# Patient Record
Sex: Female | Born: 1995 | Race: Black or African American | Hispanic: No | Marital: Single | Smoking: Current every day smoker
Health system: Southern US, Community
[De-identification: ages and names within clinical notes are randomized; demographics above are authoritative.]

## PROBLEM LIST (undated history)

## (undated) DIAGNOSIS — R569 Unspecified convulsions: Secondary | ICD-10-CM

---
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
## 2016-08-11 ENCOUNTER — Emergency Department (HOSPITAL_BASED_OUTPATIENT_CLINIC_OR_DEPARTMENT_OTHER)
Admission: EM | Admit: 2016-08-11 | Discharge: 2016-08-12 | Disposition: A | Discharge status: Home / Self Care | Payer: Self-pay | Attending: Emergency Medicine | Admitting: Emergency Medicine

## 2016-08-11 ENCOUNTER — Encounter (HOSPITAL_BASED_OUTPATIENT_CLINIC_OR_DEPARTMENT_OTHER): Payer: Self-pay | Admitting: Emergency Medicine

## 2016-08-11 DIAGNOSIS — S0990XA Unspecified injury of head, initial encounter: Secondary | ICD-10-CM

## 2016-08-11 DIAGNOSIS — Y9389 Activity, other specified: Secondary | ICD-10-CM | POA: Insufficient documentation

## 2016-08-11 DIAGNOSIS — Z23 Encounter for immunization: Secondary | ICD-10-CM | POA: Insufficient documentation

## 2016-08-11 DIAGNOSIS — Y929 Unspecified place or not applicable: Secondary | ICD-10-CM | POA: Insufficient documentation

## 2016-08-11 DIAGNOSIS — T07XXXA Unspecified multiple injuries, initial encounter: Secondary | ICD-10-CM

## 2016-08-11 DIAGNOSIS — S40212A Abrasion of left shoulder, initial encounter: Secondary | ICD-10-CM | POA: Insufficient documentation

## 2016-08-11 DIAGNOSIS — Y999 Unspecified external cause status: Secondary | ICD-10-CM | POA: Insufficient documentation

## 2016-08-11 DIAGNOSIS — S60812A Abrasion of left wrist, initial encounter: Secondary | ICD-10-CM | POA: Insufficient documentation
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
## 2016-08-12 ENCOUNTER — Emergency Department (HOSPITAL_BASED_OUTPATIENT_CLINIC_OR_DEPARTMENT_OTHER): Payer: Self-pay

## 2016-08-12 MED ORDER — HYDROCODONE-ACETAMINOPHEN 5-325 MG PO TABS: 1.0000 | ORAL_TABLET | Freq: Four times a day (QID) | ORAL | 0 refills | Status: AC | PRN

## 2016-08-12 MED ORDER — IBUPROFEN 800 MG PO TABS: 800.0000 mg | ORAL_TABLET | Freq: Three times a day (TID) | ORAL | 0 refills | Status: AC | PRN

## 2016-08-12 MED ORDER — TETANUS-DIPHTH-ACELL PERTUSSIS 5-2.5-18.5 LF-MCG/0.5 IM SUSP
0.5000 mL | Freq: Once | INTRAMUSCULAR | Status: AC
  Administered 2016-08-12: 0.5 mL via INTRAMUSCULAR
  Filled 2016-08-12: qty 0.5

## 2016-08-12 MED ORDER — HYDROCODONE-ACETAMINOPHEN 5-325 MG PO TABS
2.0000 | ORAL_TABLET | Freq: Once | ORAL | Status: AC
  Administered 2016-08-12: 2 via ORAL
  Filled 2016-08-12: qty 2

## 2016-08-12 MED ORDER — ONDANSETRON 4 MG PO TBDP
4.0000 mg | ORAL_TABLET | Freq: Once | ORAL | Status: AC
  Administered 2016-08-12: 4 mg via ORAL
  Filled 2016-08-12: qty 1

## 2016-08-12 NOTE — ED Notes (Signed)
Shoulder abrasion cleansed with wound cleanser and dressing applied. Pt updated.

## 2016-08-12 NOTE — ED Notes (Signed)
Pt given d/c instructions as per chart. Rx x 2 with instructions and precautions. Verbalizes understanding. No questions.

## 2016-08-12 NOTE — ED Notes (Signed)
ED Provider at bedside to go over results.

## 2017-06-14 ENCOUNTER — Other Ambulatory Visit: Payer: Self-pay

## 2017-06-14 ENCOUNTER — Encounter (HOSPITAL_BASED_OUTPATIENT_CLINIC_OR_DEPARTMENT_OTHER): Payer: Self-pay

## 2017-06-14 ENCOUNTER — Emergency Department (HOSPITAL_BASED_OUTPATIENT_CLINIC_OR_DEPARTMENT_OTHER): Payer: 59

## 2017-06-14 ENCOUNTER — Emergency Department (HOSPITAL_BASED_OUTPATIENT_CLINIC_OR_DEPARTMENT_OTHER)
Admission: EM | Admit: 2017-06-14 | Discharge: 2017-06-14 | Disposition: A | Discharge status: Home / Self Care | Payer: 59 | Attending: Physician Assistant | Admitting: Physician Assistant

## 2017-06-14 DIAGNOSIS — R1033 Periumbilical pain: Secondary | ICD-10-CM | POA: Diagnosis not present

## 2017-06-14 DIAGNOSIS — Z79899 Other long term (current) drug therapy: Secondary | ICD-10-CM | POA: Diagnosis not present

## 2017-06-14 DIAGNOSIS — F17210 Nicotine dependence, cigarettes, uncomplicated: Secondary | ICD-10-CM | POA: Diagnosis not present

## 2017-06-14 DIAGNOSIS — R112 Nausea with vomiting, unspecified: Secondary | ICD-10-CM | POA: Insufficient documentation

## 2017-06-14 LAB — URINALYSIS, ROUTINE W REFLEX MICROSCOPIC
Bilirubin Urine: NEGATIVE
Glucose, UA: NEGATIVE mg/dL
Ketones, ur: >80 mg/dL — AB
Leukocytes, UA: NEGATIVE
Nitrite: NEGATIVE
Protein, ur: 30 mg/dL — AB
Specific Gravity, Urine: 1.025 (ref 1.005–1.030)
pH: 7 (ref 5.0–8.0)

## 2017-06-14 LAB — COMPREHENSIVE METABOLIC PANEL
ALK PHOS: 74 U/L (ref 38–126)
ALT: 29 U/L (ref 14–54)
ANION GAP: 14 (ref 5–15)
AST: 45 U/L — ABNORMAL HIGH (ref 15–41)
Albumin: 4.7 g/dL (ref 3.5–5.0)
BILIRUBIN TOTAL: 0.5 mg/dL (ref 0.3–1.2)
BUN: 13 mg/dL (ref 6–20)
CO2: 19 mmol/L — ABNORMAL LOW (ref 22–32)
CREATININE: 1.05 mg/dL — AB (ref 0.44–1.00)
Calcium: 9.1 mg/dL (ref 8.9–10.3)
Chloride: 103 mmol/L (ref 101–111)
GFR calc non Af Amer: >60 mL/min (ref >60)
GLUCOSE: 111 mg/dL — AB (ref 65–99)
Potassium: 3.2 mmol/L — ABNORMAL LOW (ref 3.5–5.1)
Sodium: 136 mmol/L (ref 135–145)
TOTAL PROTEIN: 8.1 g/dL (ref 6.5–8.1)

## 2017-06-14 LAB — CBC WITH DIFFERENTIAL/PLATELET
Basophils Absolute: 0 10*3/uL (ref 0.0–0.1)
Basophils Relative: 0 %
Eosinophils Absolute: 0 10*3/uL (ref 0.0–0.7)
Eosinophils Relative: 0 %
HCT: 38.7 % (ref 36.0–46.0)
Hemoglobin: 13.8 g/dL (ref 12.0–15.0)
Lymphocytes Relative: 5 %
Lymphs Abs: 0.7 10*3/uL (ref 0.7–4.0)
MCH: 33.4 pg (ref 26.0–34.0)
MCHC: 35.7 g/dL (ref 30.0–36.0)
MCV: 93.7 fL (ref 78.0–100.0)
Monocytes Absolute: 0.6 10*3/uL (ref 0.1–1.0)
Monocytes Relative: 4 %
Neutro Abs: 14.2 10*3/uL — ABNORMAL HIGH (ref 1.7–7.7)
Neutrophils Relative %: 91 %
Platelets: 288 10*3/uL (ref 150–400)
RBC: 4.13 MIL/uL (ref 3.87–5.11)
RDW: 12.3 % (ref 11.5–15.5)
WBC: 15.5 10*3/uL — ABNORMAL HIGH (ref 4.0–10.5)

## 2017-06-14 LAB — PREGNANCY, URINE: Preg Test, Ur: NEGATIVE

## 2017-06-14 LAB — URINALYSIS, MICROSCOPIC (REFLEX)

## 2017-06-14 LAB — LIPASE, BLOOD: Lipase: 19 U/L (ref 11–51)

## 2017-06-14 MED ORDER — FAMOTIDINE IN NACL 20-0.9 MG/50ML-% IV SOLN
20.0000 mg | Freq: Once | INTRAVENOUS | Status: AC
  Administered 2017-06-14: 20 mg via INTRAVENOUS
  Filled 2017-06-14: qty 50

## 2017-06-14 MED ORDER — GI COCKTAIL ~~LOC~~
30.0000 mL | Freq: Once | ORAL | Status: AC
  Administered 2017-06-14: 30 mL via ORAL
  Filled 2017-06-14: qty 30

## 2017-06-14 MED ORDER — KETOROLAC TROMETHAMINE 30 MG/ML IJ SOLN
30.0000 mg | Freq: Once | INTRAMUSCULAR | Status: AC
  Administered 2017-06-14: 30 mg via INTRAVENOUS
  Filled 2017-06-14: qty 1

## 2017-06-14 MED ORDER — SODIUM CHLORIDE 0.9 % IV BOLUS
1000.0000 mL | Freq: Once | INTRAVENOUS | Status: AC
  Administered 2017-06-14: 1000 mL via INTRAVENOUS

## 2017-06-14 MED ORDER — POTASSIUM CHLORIDE CRYS ER 20 MEQ PO TBCR
40.0000 meq | EXTENDED_RELEASE_TABLET | Freq: Once | ORAL | Status: AC
  Administered 2017-06-14: 40 meq via ORAL
  Filled 2017-06-14: qty 2

## 2017-06-14 MED ORDER — ONDANSETRON 4 MG PO TBDP: ORAL_TABLET | ORAL | 0 refills | Status: AC

## 2017-06-14 MED ORDER — IOPAMIDOL (ISOVUE-300) INJECTION 61%
100.0000 mL | Freq: Once | INTRAVENOUS | Status: AC | PRN
  Administered 2017-06-14: 100 mL via INTRAVENOUS

## 2017-06-14 MED ORDER — ONDANSETRON 4 MG PO TBDP
ORAL_TABLET | ORAL | Status: AC
  Filled 2017-06-14: qty 1

## 2017-06-14 MED ORDER — PROMETHAZINE HCL 25 MG/ML IJ SOLN
25.0000 mg | Freq: Once | INTRAMUSCULAR | Status: AC
  Administered 2017-06-14: 25 mg via INTRAVENOUS
  Filled 2017-06-14: qty 1

## 2017-06-14 MED ORDER — FAMOTIDINE 20 MG PO TABS: 20.0000 mg | ORAL_TABLET | Freq: Two times a day (BID) | ORAL | 0 refills | Status: AC

## 2017-06-14 MED ORDER — ONDANSETRON 4 MG PO TBDP
4.0000 mg | ORAL_TABLET | Freq: Once | ORAL | Status: AC
Start: 2017-06-14 — End: 2017-06-14
  Administered 2017-06-14: 4 mg via ORAL
  Filled 2017-06-14: qty 1

## 2017-06-14 MED ORDER — ONDANSETRON HCL 4 MG/2ML IJ SOLN
INTRAMUSCULAR | Status: AC
  Administered 2017-06-14: 4 mg
  Filled 2017-06-14: qty 2

## 2017-06-14 NOTE — Discharge Instructions (Signed)
Evaluation today is overall reassuring, please use Zofran as needed for nausea, take Pepcid daily, you may use Tylenol for pain, please avoid ibuprofen as it can further irritate your stomach.    Your CT does not show any specific cause of your pain, it does show some nonspecific high density material in your stomach and small intestines, which may be something trying to be digested, it also shows a small amount of fluid around your gallbladder but this is thought to be because of the IV fluids he received here in your gallbladder does not appear inflamed.  These follow-up with your primary care doctor on Monday for recheck.  If you have any worsening or localizing of your abdominal pain, persistent vomiting and or unable to keep down fluids, fevers or chills or any other new or concerning symptoms do not hesitate to return to the emergency department.

## 2017-06-14 NOTE — ED Notes (Signed)
Requested urine specimen but pt sts she is not able to void right now. Is not willing to try at this time.

## 2017-06-14 NOTE — ED Provider Notes (Signed)
[facility] EMERGENCY DEPARTMENT Provider Note   CSN: [id number] Arrival date & time: 06/14/17  1346     History   Chief Complaint Chief Complaint  Patient presents with  . Emesis    HPI Beth Stephenson is a 22 y.o. female.  Beth Stephenson is a 22 y.o. Female with a history of seizures, presents to the ED for evaluation of abdominal pain and vomiting since this morning.  She reports symptoms started around 8 AM with epigastric and periumbilical abdominal pain and several episodes of emesis.  Patient denies any lower abdominal pain.  Patient denies any hematemesis.  She reports one episode of loose stool while here waiting in the emergency department, denies any melena or hematochezia.  She denies having symptoms like this previously.  She reports chills but no fevers.  No cough or URI symptoms no chest pain or shortness of breath.  Patient denies any urinary frequency, dysuria or hematuria no flank pain.  Patient denies any vaginal discharge or pelvic pain, she started her menstrual cycle today which is typical for her.  She reports he is sexually active only with women.  She does report she drank a large amount of alcohol last night which she does not typically do.  Patient also reports daily marijuana use and occasional cigarette use denies any other substances.     Past Medical History:  Diagnosis Date  . Seizure (HCC)     There are no active problems to display for this patient.   History reviewed. No pertinent surgical history.   OB History   None      Home Medications    Prior to Admission medications   Medication Sig Start Date End Date Taking? Authorizing Provider  famotidine (PEPCID) 20 MG tablet Take 1 tablet (20 mg total) by mouth 2 (two) times daily. 06/14/17   Dartha Lodge, PA-C  HYDROcodone-acetaminophen (NORCO/VICODIN) 5-325 MG tablet Take 1-2 tablets by mouth every 6 (six) hours as needed. 08/12/16   Ward, Layla Maw, DO  ibuprofen (ADVIL,MOTRIN) 800 MG  tablet Take 1 tablet (800 mg total) by mouth every 8 (eight) hours as needed for mild pain. 08/12/16   Ward, Layla Maw, DO  ondansetron (ZOFRAN ODT) 4 MG disintegrating tablet 4mg  ODT q4 hours prn nausea/vomit 06/14/17   Dartha Lodge, PA-C    Family History No family history on file.  Social History Social History   Tobacco Use  . Smoking status: Never Smoker  . Smokeless tobacco: Never Used  Substance Use Topics  . Alcohol use: No  . Drug use: No     Allergies   Patient has no known allergies.   Review of Systems Review of Systems  Constitutional: Positive for chills. Negative for fever.  HENT: Negative for congestion, rhinorrhea and sore throat.   Eyes: Negative for visual disturbance.  Respiratory: Negative for cough and shortness of breath.   Cardiovascular: Negative for chest pain.  Gastrointestinal: Positive for abdominal pain, diarrhea, nausea and vomiting. Negative for blood in stool.  Genitourinary: Negative for dysuria, flank pain, frequency, pelvic pain, vaginal bleeding and vaginal discharge.  Musculoskeletal: Negative for arthralgias and myalgias.  Skin: Negative for color change and rash.  Neurological: Negative for dizziness, syncope and light-headedness.     Physical Exam Updated Vital Signs BP 105/65 (BP Location: Right Arm)   Pulse 73   Temp 98.3 F (36.8 C) (Oral)   Resp 20   Ht 5\' 2"  (1.575 m)   Wt 59 kg (130  lb)   LMP 06/14/2017   SpO2 98%   BMI 23.78 kg/m   Physical Exam  Constitutional: She appears well-developed and well-nourished. No distress.  Vomiting during encounter, appears uncomfortable but in NAD  HENT:  Head: Normocephalic and atraumatic.  Mouth/Throat: Oropharynx is clear and moist.  Eyes: Right eye exhibits no discharge. Left eye exhibits no discharge.  Neck: Neck supple.  Cardiovascular: Normal rate, regular rhythm, normal heart sounds and intact distal pulses.  Pulmonary/Chest: Effort normal and breath sounds normal. No  stridor. No respiratory distress. She has no wheezes. She has no rales.  Respirations equal and unlabored, patient able to speak in full sentences, lungs clear to auscultation bilaterally  Abdominal: Soft. Bowel sounds are normal. She exhibits no distension and no mass. There is tenderness. There is guarding. There is no rebound.  Abdomen soft, non-distended, BS presents, focal tenderness in periumbilical and epigastric region with guarding, no RUQ tenderness, neg murphy's, no lower abdominal tenderness, no CVA tenderness  Neurological: She is alert. Coordination normal.  Skin: Skin is warm and dry. Capillary refill takes less than 2 seconds. She is not diaphoretic.  Psychiatric: She has a normal mood and affect. Her behavior is normal.  Nursing note and vitals reviewed.    ED Treatments / Results  Labs (all labs ordered are listed, but only abnormal results are displayed) Labs Reviewed  URINALYSIS, ROUTINE W REFLEX MICROSCOPIC - Abnormal; Notable for the following components:      Result Value   APPearance CLOUDY (*)    Hgb urine dipstick LARGE (*)    Ketones, ur >80 (*)    Protein, ur 30 (*)    All other components within normal limits  CBC WITH DIFFERENTIAL/PLATELET - Abnormal; Notable for the following components:   WBC 15.5 (*)    Neutro Abs 14.2 (*)    All other components within normal limits  COMPREHENSIVE METABOLIC PANEL - Abnormal; Notable for the following components:   Potassium 3.2 (*)    CO2 19 (*)    Glucose, Bld 111 (*)    Creatinine, Ser 1.05 (*)    AST 45 (*)    All other components within normal limits  URINALYSIS, MICROSCOPIC (REFLEX) - Abnormal; Notable for the following components:   Bacteria, UA RARE (*)    Squamous Epithelial / LPF 0-5 (*)    All other components within normal limits  PREGNANCY, URINE  LIPASE, BLOOD    EKG None  Radiology Ct Abdomen Pelvis W Contrast  Result Date: 06/14/2017 CLINICAL DATA:  Nausea and vomiting. Periumbilical  pain. No fever. Elevated white blood cell count EXAM: CT ABDOMEN AND PELVIS WITH CONTRAST TECHNIQUE: Multidetector CT imaging of the abdomen and pelvis was performed using the standard protocol following bolus administration of intravenous contrast. CONTRAST:  100mL ISOVUE-300 IOPAMIDOL (ISOVUE-300) INJECTION 61% COMPARISON:  None. FINDINGS: Lower chest: Lung bases are clear. Hepatobiliary: No focal hepatic lesion. Mild periportal edema. There is small amount pericholecystic fluid. No gallbladder distension. No radiodense gallstones. Common bile duct normal caliber. Pancreas: Pancreas is normal. No ductal dilatation. No pancreatic inflammation. Spleen: Normal spleen Adrenals/urinary tract: Adrenal glands and kidneys are normal. The ureters and bladder normal. Stomach/Bowel: Stomach, duodenum small bowel are normal. Scattered high-density material in the lower abdomen which is favored within the small bowel. Same density of high-density material within the stomach. (Image 50/2 and 45/2 for example). Favor ingested material The appendix is partially imaged appears retrocecal without inflammation. The ascending transverse colon normal. Descending colon rectosigmoid colon  normal. Vascular/Lymphatic: Abdominal aorta is normal caliber. No periportal or retroperitoneal adenopathy. No pelvic adenopathy. Reproductive: Uterus and ovaries normal.  Smaller free fluid pelv Other: This no ventral hernia Musculoskeletal: No aggressive osseous lesion. IMPRESSION: 1. Mild pericholecystic fluid likely represents likely related to the periportal edema. Query fluid resuscitation 2. Appendix is partially imaged and appears normal. 3. Linear high density material within the stomach and small bowel is likely ingested material. 4. Small amount free fluid the pelvis Electronically Signed   By: Genevive Bi M.D.   On: 06/14/2017 20:03    Procedures Procedures (including critical care time)  Medications Ordered in ED Medications    ondansetron (ZOFRAN-ODT) disintegrating tablet 4 mg (4 mg Oral Given 06/14/17 1615)  ondansetron (ZOFRAN) 4 MG/2ML injection (4 mg  Given 06/14/17 1558)  sodium chloride 0.9 % bolus 1,000 mL (0 mLs Intravenous Stopped 06/14/17 1654)  famotidine (PEPCID) IVPB 20 mg premix (0 mg Intravenous Stopped 06/14/17 1652)  potassium chloride SA (K-DUR,KLOR-CON) CR tablet 40 mEq (40 mEq Oral Given 06/14/17 1658)  sodium chloride 0.9 % bolus 1,000 mL (0 mLs Intravenous Stopped 06/14/17 1841)  ketorolac (TORADOL) 30 MG/ML injection 30 mg (30 mg Intravenous Given 06/14/17 1656)  gi cocktail (Maalox,Lidocaine,Donnatal) (30 mLs Oral Given 06/14/17 1659)  promethazine (PHENERGAN) injection 25 mg (25 mg Intravenous Given 06/14/17 1718)  iopamidol (ISOVUE-300) 61 % injection 100 mL (100 mLs Intravenous Contrast Given 06/14/17 1921)     Initial Impression / Assessment and Plan / ED Course  I have reviewed the triage vital signs and the nursing notes.  Pertinent labs & imaging results that were available during my care of the patient were reviewed by me and considered in my medical decision making (see chart for details).   Patient presents to the ED for evaluation of acute onset of epigastric and periumbilical abdominal pain, nausea and vomiting.  No associated fevers, no lower abdominal pain, no urinary or vaginal symptoms.  Patient does report drinking a large amount of alcohol last night.  Patient is actively vomiting on exam.  Vitals normal, focal tenderness in the epigastric and periumbilical regions, no right upper quadrant or lower abdominal tenderness.  Given location of tenderness, acute gastritis more likely, given large amount of alcohol during patient could also have alcoholic pancreatitis, or early appendicitis given periumbilical tenderness, will get labs and begin symptomatic treatment with IV fluids, Zofran, will start with Toradol for pain.  Patient with leukocytosis of 15.5 with left shift, normal  hemoglobin, potassium is 3.2 will replace orally, no other acute electrolyte derangements requiring intervention, creatinine is slightly increased at 1.05, likely in the setting of dehydration, 2 L fluid bolus given. LFTs are unremarkable, lipase is normal, urinalysis without evidence of infection and negative urine pregnancy.  On reevaluation patient is continuing to vomit despite Zofran continuing to complain of pain attempted GI cocktail but patient vomited this up, will get Phenergan given persistent tenderness on abdominal exam, and continued vomiting will get CT scan to assess for any acute intraabdominal pathology causing symptoms.  CT shows mild pericholecystic fluid without any evidence of gallbladder inflammation this is thought to likely be in the setting of fluid resuscitation, the appendix is partially imaged and appears normal, there is some linear high density material within the stomach and small bowel but is likely ingested material, this could potentially be the Maalox from the GI cocktail that the patient received she also reports taking Pepto-Bismol has not ingested any other concerning substances, no other acute findings  on CT scan.  Discussed these results with the patient she is feeling much better symptomatically with no additional episodes of vomiting and vast improvement in her pain abdominal exam is now benign do not feel any additional imaging or evaluations are necessary at this time will have patient take Zofran as needed for nausea and Pepcid encouraged her to advance diet slowly, encourage her to avoid alcohol and marijuana while symptoms are resolving.  Strict return precautions discussed with patient has any worsening or localizing abdominal pain she is to return to the emergency department.  She is to follow-up with your primary care doctor.  She expresses understanding and is in agreement with plan.    Final Clinical Impressions(s) / ED Diagnoses   Final diagnoses:    Non-intractable vomiting with nausea, unspecified vomiting type  Periumbilical abdominal pain    ED Discharge Orders        Ordered    ondansetron (ZOFRAN ODT) 4 MG disintegrating tablet     06/14/17 2018    famotidine (PEPCID) 20 MG tablet  2 times daily     06/14/17 2018       Dartha Lodge, PA-C 06/15/17 1158    Mackuen, Cindee Salt, MD 06/15/17 [phone]

## 2017-06-14 NOTE — ED Notes (Signed)
CT must wait for urine pregnancy test to result prior to imaging [facility]

## 2017-06-14 NOTE — ED Notes (Signed)
ED Provider at bedside. 

## 2017-06-14 NOTE — ED Triage Notes (Signed)
Pt c/o vomiting with abdominal pain since this morning

## 2018-01-11 ENCOUNTER — Emergency Department (HOSPITAL_BASED_OUTPATIENT_CLINIC_OR_DEPARTMENT_OTHER)
Admission: EM | Admit: 2018-01-11 | Discharge: 2018-01-11 | Disposition: A | Discharge status: Home / Self Care | Payer: 59 | Attending: Emergency Medicine | Admitting: Emergency Medicine

## 2018-01-11 ENCOUNTER — Encounter (HOSPITAL_BASED_OUTPATIENT_CLINIC_OR_DEPARTMENT_OTHER): Payer: Self-pay | Admitting: Emergency Medicine

## 2018-01-11 ENCOUNTER — Other Ambulatory Visit: Payer: Self-pay

## 2018-01-11 DIAGNOSIS — F17210 Nicotine dependence, cigarettes, uncomplicated: Secondary | ICD-10-CM | POA: Diagnosis not present

## 2018-01-11 DIAGNOSIS — R1030 Lower abdominal pain, unspecified: Secondary | ICD-10-CM | POA: Diagnosis present

## 2018-01-11 LAB — CBC WITH DIFFERENTIAL/PLATELET
Abs Immature Granulocytes: 0.04 10*3/uL (ref 0.00–0.07)
Basophils Absolute: 0 10*3/uL (ref 0.0–0.1)
Basophils Relative: 0 %
EOS ABS: 0.1 10*3/uL (ref 0.0–0.5)
EOS PCT: 1 %
HCT: 38.2 % (ref 36.0–46.0)
Hemoglobin: 12.3 g/dL (ref 12.0–15.0)
IMMATURE GRANULOCYTES: 0 %
LYMPHS ABS: 1.5 10*3/uL (ref 0.7–4.0)
Lymphocytes Relative: 14 %
MCH: 31.3 pg (ref 26.0–34.0)
MCHC: 32.2 g/dL (ref 30.0–36.0)
MCV: 97.2 fL (ref 80.0–100.0)
Monocytes Absolute: 0.9 10*3/uL (ref 0.1–1.0)
Monocytes Relative: 8 %
Neutro Abs: 8.2 10*3/uL — ABNORMAL HIGH (ref 1.7–7.7)
Neutrophils Relative %: 77 %
PLATELETS: 256 10*3/uL (ref 150–400)
RBC: 3.93 MIL/uL (ref 3.87–5.11)
RDW: 12.2 % (ref 11.5–15.5)
WBC: 10.8 10*3/uL — ABNORMAL HIGH (ref 4.0–10.5)
nRBC: 0 % (ref 0.0–0.2)

## 2018-01-11 LAB — URINALYSIS, MICROSCOPIC (REFLEX)

## 2018-01-11 LAB — COMPREHENSIVE METABOLIC PANEL
ALBUMIN: 3.9 g/dL (ref 3.5–5.0)
ALT: 14 U/L (ref 0–44)
ANION GAP: 9 (ref 5–15)
AST: 20 U/L (ref 15–41)
Alkaline Phosphatase: 50 U/L (ref 38–126)
BUN: 14 mg/dL (ref 6–20)
CHLORIDE: 108 mmol/L (ref 98–111)
CO2: 23 mmol/L (ref 22–32)
CREATININE: 0.92 mg/dL (ref 0.44–1.00)
Calcium: 9.2 mg/dL (ref 8.9–10.3)
GFR calc Af Amer: >60 mL/min (ref >60)
GFR calc non Af Amer: >60 mL/min (ref >60)
GLUCOSE: 89 mg/dL (ref 70–99)
Potassium: 4 mmol/L (ref 3.5–5.1)
SODIUM: 140 mmol/L (ref 135–145)
Total Bilirubin: 0.3 mg/dL (ref 0.3–1.2)
Total Protein: 6.4 g/dL — ABNORMAL LOW (ref 6.5–8.1)

## 2018-01-11 LAB — URINALYSIS, ROUTINE W REFLEX MICROSCOPIC
Bilirubin Urine: NEGATIVE
Glucose, UA: NEGATIVE mg/dL
Ketones, ur: 15 mg/dL — AB
Leukocytes, UA: NEGATIVE
NITRITE: NEGATIVE
Protein, ur: NEGATIVE mg/dL
pH: 6 (ref 5.0–8.0)

## 2018-01-11 LAB — PREGNANCY, URINE: PREG TEST UR: NEGATIVE

## 2018-01-11 LAB — LIPASE, BLOOD: Lipase: 29 U/L (ref 11–51)

## 2018-01-11 MED ORDER — SODIUM CHLORIDE 0.9 % IV BOLUS
1000.0000 mL | Freq: Once | INTRAVENOUS | Status: AC
  Administered 2018-01-11: 1000 mL via INTRAVENOUS

## 2018-01-11 MED ORDER — NAPROXEN 500 MG PO TABS: 500.0000 mg | ORAL_TABLET | Freq: Two times a day (BID) | ORAL | 0 refills | Status: AC

## 2018-01-11 MED ORDER — KETOROLAC TROMETHAMINE 15 MG/ML IJ SOLN
15.0000 mg | Freq: Once | INTRAMUSCULAR | Status: AC
  Administered 2018-01-11: 15 mg via INTRAVENOUS
  Filled 2018-01-11: qty 1

## 2018-01-11 NOTE — ED Triage Notes (Signed)
Patient reports lower abdominal pain that began today.  States that she has history of ovarian cysts and this feels similar to that.  Reports 1 episode of vomiting.  States menstrual cycle started yesterday.

## 2018-01-11 NOTE — Discharge Instructions (Signed)
Take naproxen 2 times a day with meals.  Do not take other anti-inflammatories at the same time (Advil, Motrin, ibuprofen, Aleve). You may supplement with Tylenol if you need further pain control. Use heating pads to help control your pain. It is important that you follow-up with OB/GYN.  There is information for 2 practices listed below. Return to the emergency room if you develop high fevers, persistent vomiting, severe worsening abdominal pain, or any new, concerning, or worsening symptoms.

## 2018-01-11 NOTE — ED Provider Notes (Signed)
[facility] EMERGENCY DEPARTMENT Provider Note   CSN: [id number] Arrival date & time: 01/11/18  4098     History   Chief Complaint Chief Complaint  Patient presents with  . Abdominal Pain    HPI Beth Stephenson is a 22 y.o. female senting for evaluation of lower abdominal pain and one episode of vomiting.  Patient states that when she woke up this morning, she had lower abdominal pain.  Not wake her up from sleep, but she noticed it when she was awake.  It is suprapubic.  Described as sharp and constant.  Nothing makes it better or worse.  She had one episode of vomiting, but reports no nausea.  She states this feels similar to last month when she started her period and was diagnosed with an ovarian cyst.  She states that she does have period cramps, but they are normally mild.  For the past 3 days, she has been having loose stools, they are nonbloody.  She has not taken anything for pain including Tylenol or ibuprofen.  Patient states she had 5-6 shots last night, denies other alcohol use.  She denies tobacco or drug use.  She denies fevers, chills, chest pain, shortness of breath, upper abdominal pain, urinary symptoms. She denies vaginal discharge. She started her period yesterday. She is sexually active with one female partner. She does not use contraception. She denies a history of abdominal problems or previous abdominal surgeries.  History obtained from chart review.  Patient seen in the ER in February in which she had a normal CT scan.  She was seen at [facility] ER last month for similar suprapubic abdominal pain, during which an ultrasound showed cysts and fluid consistent with recently burst ovarian cyst.  She was supposed to follow-up with OB/GYN, but never did.  HPI  Past Medical History:  Diagnosis Date  . Seizure (HCC)     There are no active problems to display for this patient.   History reviewed. No pertinent surgical history.   OB History   None       Home Medications    Prior to Admission medications   Medication Sig Start Date End Date Taking? Authorizing Provider  famotidine (PEPCID) 20 MG tablet Take 1 tablet (20 mg total) by mouth 2 (two) times daily. 06/14/17   Dartha Lodge, PA-C  HYDROcodone-acetaminophen (NORCO/VICODIN) 5-325 MG tablet Take 1-2 tablets by mouth every 6 (six) hours as needed. 08/12/16   Ward, Layla Maw, DO  ibuprofen (ADVIL,MOTRIN) 800 MG tablet Take 1 tablet (800 mg total) by mouth every 8 (eight) hours as needed for mild pain. 08/12/16   Ward, Layla Maw, DO  naproxen (NAPROSYN) 500 MG tablet Take 1 tablet (500 mg total) by mouth 2 (two) times daily with a meal. 01/11/18   Riely Oetken, PA-C  ondansetron (ZOFRAN ODT) 4 MG disintegrating tablet 4mg  ODT q4 hours prn nausea/vomit 06/14/17   Dartha Lodge, PA-C    Family History History reviewed. No pertinent family history.  Social History Social History   Tobacco Use  . Smoking status: Current Every Day Smoker    Packs/day: 0.50    Types: Cigarettes  . Smokeless tobacco: Never Used  Substance Use Topics  . Alcohol use: No  . Drug use: No     Allergies   Patient has no known allergies.   Review of Systems Review of Systems  Gastrointestinal: Positive for vomiting (one episode).  Genitourinary: Positive for pelvic pain.  All other systems  reviewed and are negative.    Physical Exam Updated Vital Signs BP 110/65 (BP Location: Left Arm)   Pulse 65   Temp 98.2 F (36.8 C) (Oral)   Resp 18   Ht 5\' 2"  (1.575 m)   Wt 57.2 kg   LMP 01/11/2018   SpO2 100%   BMI 23.05 kg/m   Physical Exam  Constitutional: She is oriented to person, place, and time. She appears well-developed and well-nourished. No distress.  Laying comfortably in the bed in no acute distress  HENT:  Head: Normocephalic and atraumatic.  Eyes: Pupils are equal, round, and reactive to light. Conjunctivae and EOM are normal.  Neck: Normal range of motion. Neck supple.   Cardiovascular: Normal rate, regular rhythm and intact distal pulses.  Pulmonary/Chest: Effort normal and breath sounds normal. No respiratory distress. She has no wheezes.  Abdominal: Soft. She exhibits no distension and no mass. There is tenderness. There is no rebound and no guarding.  Mild suprapubic tenderness without rigidity, guarding, distention.  No tenderness at McBurney's point.  Negative Murphy sign.  Negative rebound.  Musculoskeletal: Normal range of motion.  Neurological: She is alert and oriented to person, place, and time.  Skin: Skin is warm and dry. Capillary refill takes less than 2 seconds.  Psychiatric: She has a normal mood and affect.  Nursing note and vitals reviewed.    ED Treatments / Results  Labs (all labs ordered are listed, but only abnormal results are displayed) Labs Reviewed  URINALYSIS, ROUTINE W REFLEX MICROSCOPIC - Abnormal; Notable for the following components:      Result Value   Color, Urine AMBER (*)    APPearance CLOUDY (*)    Specific Gravity, Urine >1.030 (*)    Hgb urine dipstick LARGE (*)    Ketones, ur 15 (*)    All other components within normal limits  URINALYSIS, MICROSCOPIC (REFLEX) - Abnormal; Notable for the following components:   Bacteria, UA MANY (*)    All other components within normal limits  CBC WITH DIFFERENTIAL/PLATELET - Abnormal; Notable for the following components:   WBC 10.8 (*)    Neutro Abs 8.2 (*)    All other components within normal limits  COMPREHENSIVE METABOLIC PANEL - Abnormal; Notable for the following components:   Total Protein 6.4 (*)    All other components within normal limits  PREGNANCY, URINE  LIPASE, BLOOD    EKG None  Radiology No results found.  Procedures Procedures (including critical care time)  Medications Ordered in ED Medications  sodium chloride 0.9 % bolus 1,000 mL (0 mLs Intravenous Stopped 01/11/18 1241)  ketorolac (TORADOL) 15 MG/ML injection 15 mg (15 mg Intravenous  Given 01/11/18 1128)     Initial Impression / Assessment and Plan / ED Course  I have reviewed the triage vital signs and the nursing notes.  Pertinent labs & imaging results that were available during my care of the patient were reviewed by me and considered in my medical decision making (see chart for details).     Pt presenting for evaluation of suprapubic abdominal pain.  Physical exam reassuring, she is afebrile not tachycardic.  Appears nontoxic.  Mild tenderness palpation of suprapubic abdomen.  Has had similar symptoms, with previous CT and ultrasound which showed ovarian cyst but otherwise reassuring.  Patient started her period yesterday.  Additionally, she had 5-6 shots of alcohol last night, and has been having several days of diarrhea.  Consider uterine cramping versus gastritis versus cramping due to diarrhea.  Will obtain labs for further evaluation, and tx with fluids and toradol and reassess.  Labs reassuring, leukocytosis improved from previous, mildly elevated at 10.7.  Otherwise, kidney, liver, pancreatic function reassuring.  Urine without obvious infection.  On reassessment, patient reports pain is improved.  Repeat abdominal exam reassuring, abdomen is soft and nontender.  Discussed follow-up with OB/GYN for further evaluation.  At this time, doubt intra-abdominal infection, perforation, obstruction, or surgical abdomen.  I do not believe CT scan or repeat ultrasound is necessary at this time.  At this time, patient appears safe for discharge.  Return precautions given.  Patient states she understands and agrees to plan.  Final Clinical Impressions(s) / ED Diagnoses   Final diagnoses:  Lower abdominal pain    ED Discharge Orders         Ordered    naproxen (NAPROSYN) 500 MG tablet  2 times daily with meals     01/11/18 1235           Sydell Prowell, PA-C 01/11/18 1301    Arby BarrettePfeiffer, Marcy, MD 01/12/18 [phone]

## 2019-05-02 IMAGING — CT CT ABD-PELV W/ CM
2 of 4 series · 16 of 46 positions shown, 18 images · IV contrast (APPLIED)
Comparison: None.

CLINICAL DATA: Nausea and vomiting. Periumbilical pain. No fever.
Elevated white blood cell count

EXAM:
CT ABDOMEN AND PELVIS WITH CONTRAST
TECHNIQUE: Multidetector CT imaging of the abdomen and pelvis was performed
using the standard protocol following bolus administration of
intravenous contrast.
CONTRAST:  100mL [id number] IOPAMIDOL ([id number]) INJECTION 61%

[Series 2: axial st · axial · 0.65mm/px · z∈[-481,-106]mm · 13 of 83 slices shown, 15 images]
[im 4/83  soft-tissue]
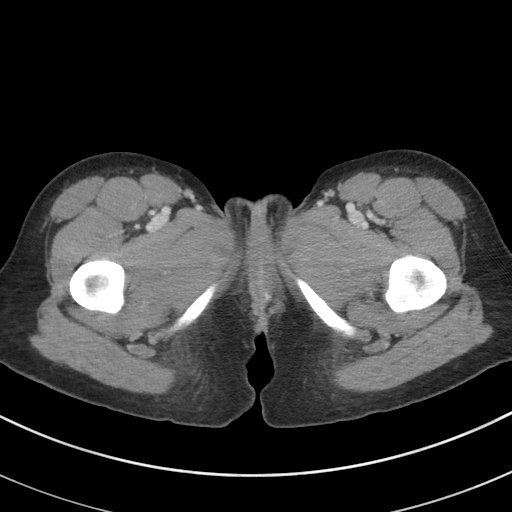
[im 4/83  bone]
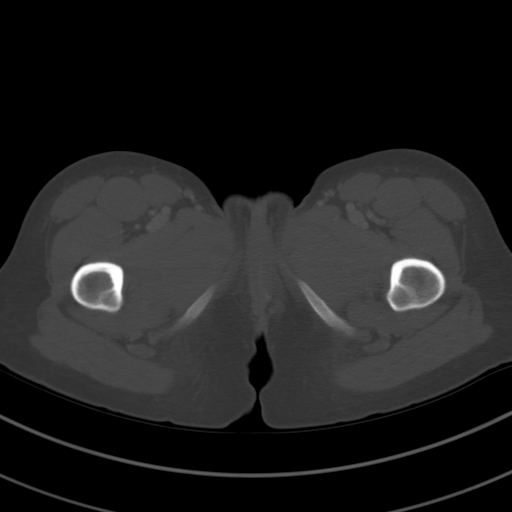
[im 11/83  soft-tissue]
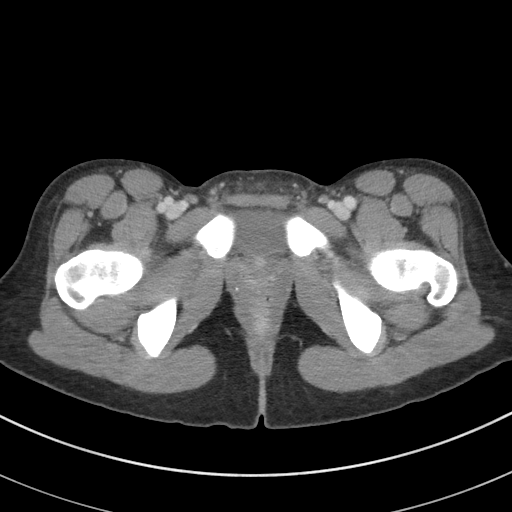
[im 18/83  soft-tissue]
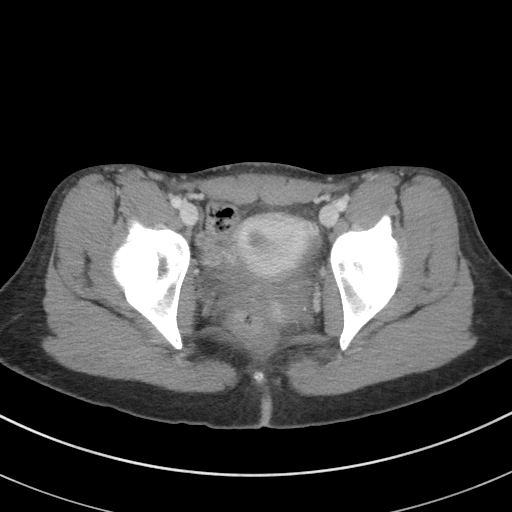
[im 24/83  soft-tissue]
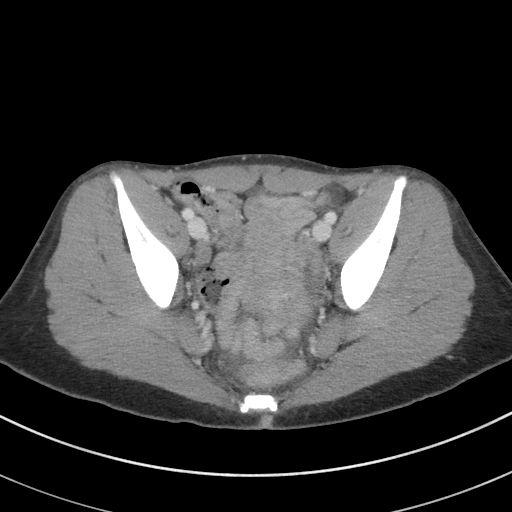
[im 28/83  soft-tissue]
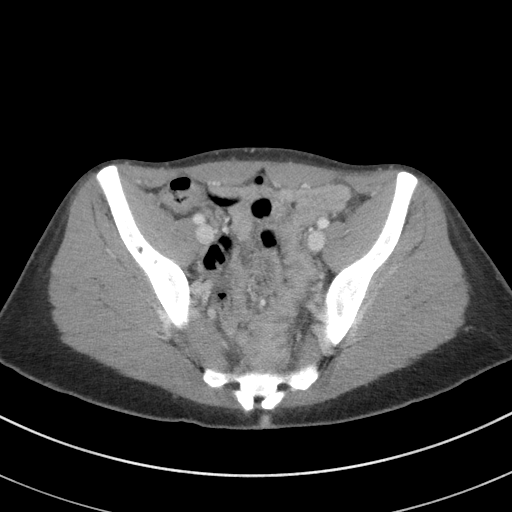
[im 35/83  soft-tissue]
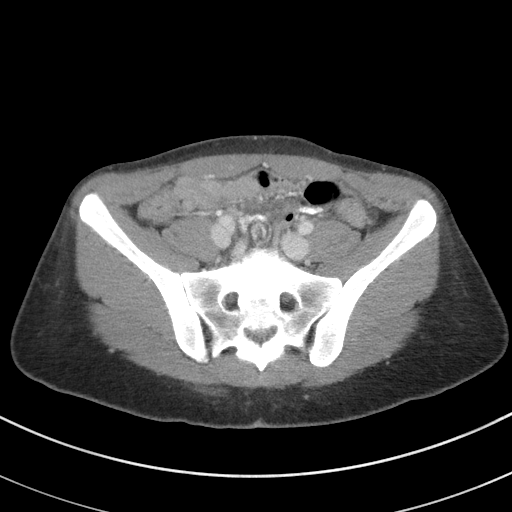
[im 42/83  soft-tissue]
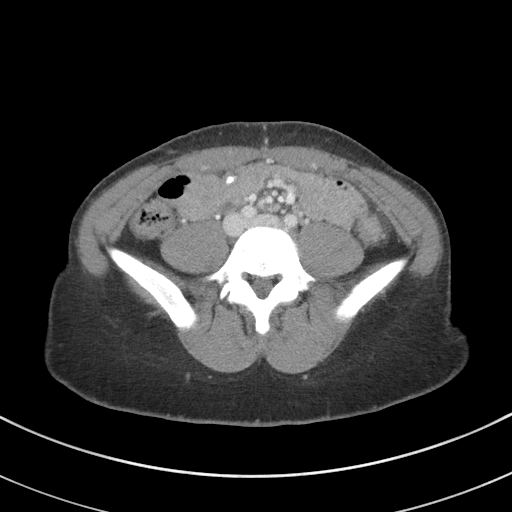
[im 48/83  soft-tissue]
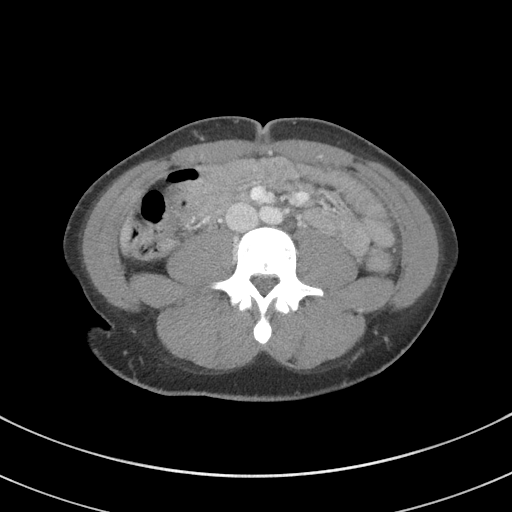
[im 55/83  soft-tissue]
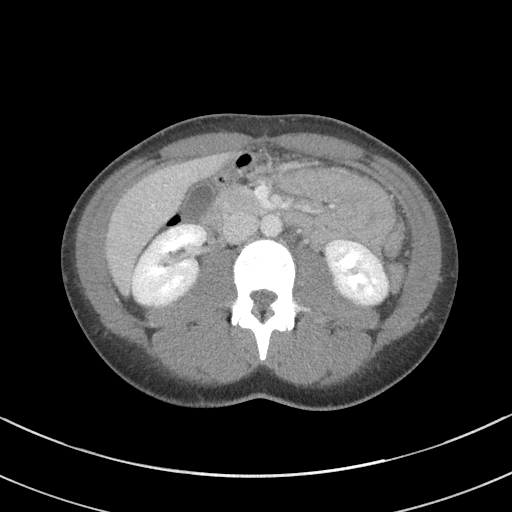
[im 55/83  bone]
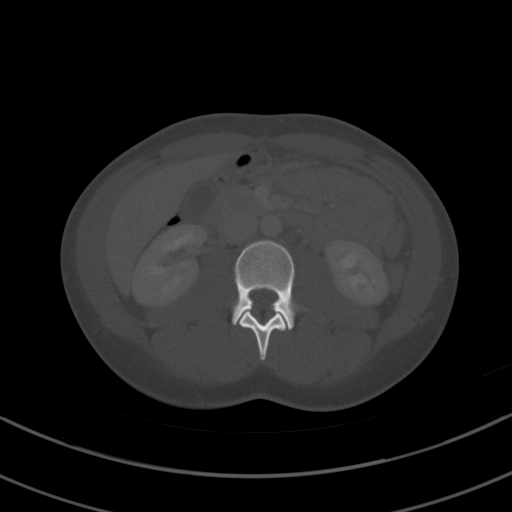
[im 59/83  soft-tissue]
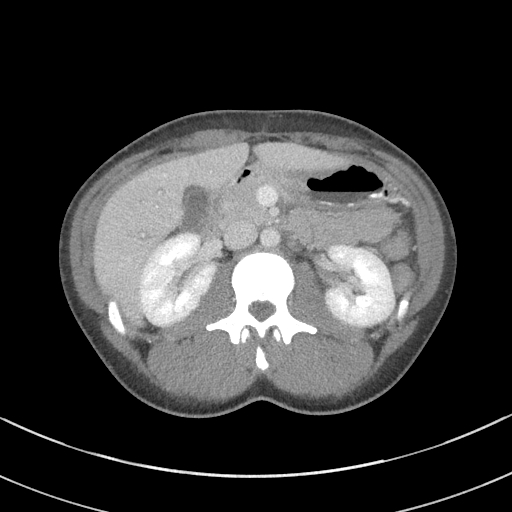
[im 65/83  soft-tissue]
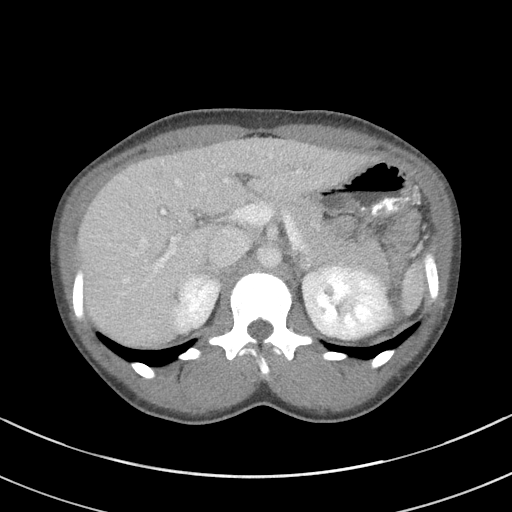
[im 72/83  soft-tissue]
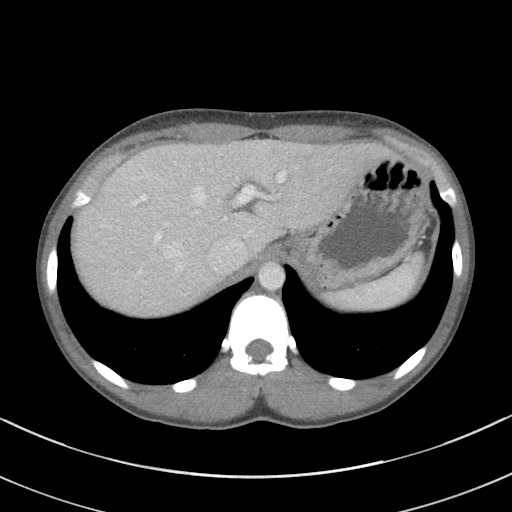
[im 79/83  soft-tissue]
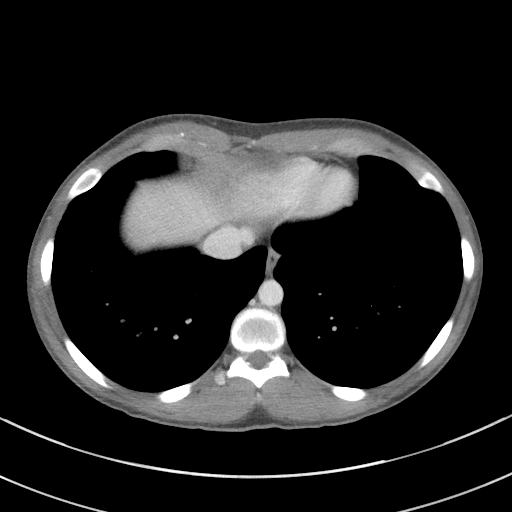

[Series 5: coronal st · coronal · 0.67mm/px · 3 of 67 slices shown]
[im 23/67  soft-tissue]
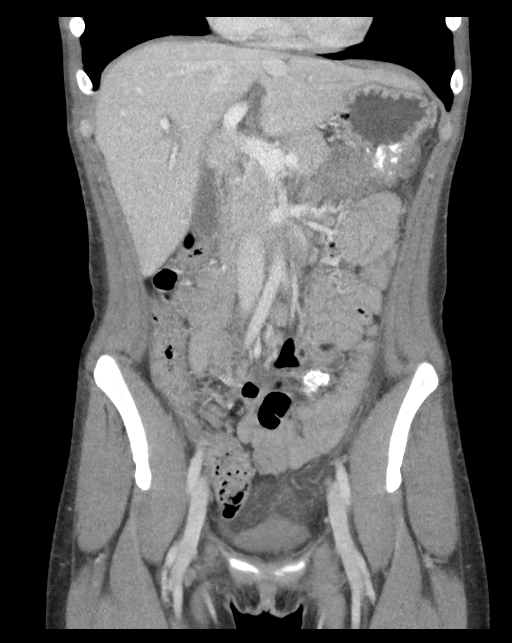
[im 30/67  soft-tissue]
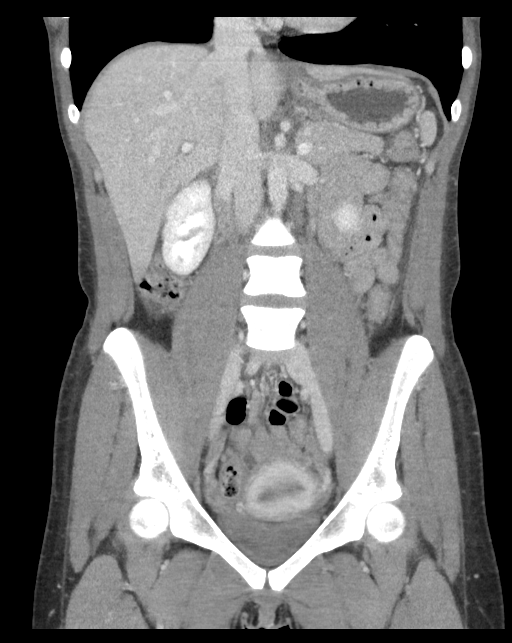
[im 37/67  soft-tissue]
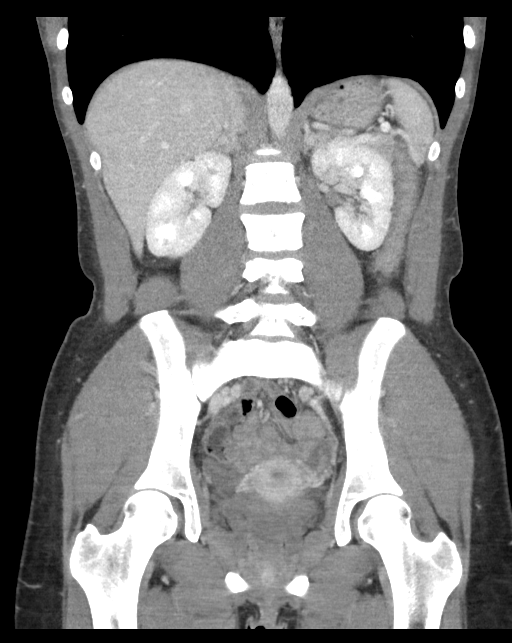

[16 of 46 positions shown; findings below may reference images not displayed]

FINDINGS: Lower chest: Lung bases are clear.

Hepatobiliary: No focal hepatic lesion. Mild periportal edema. There
is small amount pericholecystic fluid. No gallbladder distension. No
radiodense gallstones. Common bile duct normal caliber.

Pancreas: Pancreas is normal. No ductal dilatation. No pancreatic
inflammation.

Spleen: Normal spleen

Adrenals/urinary tract: Adrenal glands and kidneys are normal. The
ureters and bladder normal.

Stomach/Bowel: Stomach, duodenum small bowel are normal. Scattered
high-density material in the lower abdomen which is favored within
the small bowel. Same density of high-density material within the
stomach. (Image 50/2 and 45/2 for example). Favor ingested material

The appendix is partially imaged appears retrocecal without
inflammation. The ascending transverse colon normal. Descending
colon rectosigmoid colon normal.

Vascular/Lymphatic: Abdominal aorta is normal caliber. No periportal
or retroperitoneal adenopathy. No pelvic adenopathy.

Reproductive: Uterus and ovaries normal.  Smaller free fluid pelv

Other: This no ventral hernia

Musculoskeletal: No aggressive osseous lesion.
IMPRESSION: 1. Mild pericholecystic fluid likely represents likely related to
the periportal edema. Query fluid resuscitation
2. Appendix is partially imaged and appears normal.
3. Linear high density material within the stomach and small bowel
is likely ingested material.
4. Small amount free fluid the pelvis

## 2019-06-25 ENCOUNTER — Encounter (HOSPITAL_BASED_OUTPATIENT_CLINIC_OR_DEPARTMENT_OTHER): Payer: Self-pay | Admitting: *Deleted

## 2019-06-25 ENCOUNTER — Other Ambulatory Visit: Payer: Self-pay

## 2019-06-25 DIAGNOSIS — M549 Dorsalgia, unspecified: Secondary | ICD-10-CM | POA: Insufficient documentation

## 2019-06-25 DIAGNOSIS — Z5321 Procedure and treatment not carried out due to patient leaving prior to being seen by health care provider: Secondary | ICD-10-CM | POA: Insufficient documentation

## 2019-06-25 LAB — URINALYSIS, ROUTINE W REFLEX MICROSCOPIC
Bilirubin Urine: NEGATIVE
Glucose, UA: NEGATIVE mg/dL
Ketones, ur: 15 mg/dL — AB
Leukocytes,Ua: NEGATIVE
Nitrite: NEGATIVE
Protein, ur: NEGATIVE mg/dL
Specific Gravity, Urine: 1.015 (ref 1.005–1.030)
pH: 8 (ref 5.0–8.0)

## 2019-06-25 LAB — URINALYSIS, MICROSCOPIC (REFLEX)
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
## 2019-06-26 ENCOUNTER — Emergency Department (HOSPITAL_BASED_OUTPATIENT_CLINIC_OR_DEPARTMENT_OTHER)
Admission: EM | Admit: 2019-06-26 | Discharge: 2019-06-26 | Disposition: A | Discharge status: Home / Self Care | Payer: Self-pay | Attending: Emergency Medicine | Admitting: Emergency Medicine

## 2019-06-26 NOTE — ED Notes (Signed)
This RN checked on patient in room, offered comfort measures. Pt stating she wants pain medication. Informed her she would have to wait on MD for evaluation. Pt decided to leave due to wait.

## 2022-12-02 ENCOUNTER — Emergency Department (HOSPITAL_BASED_OUTPATIENT_CLINIC_OR_DEPARTMENT_OTHER): Payer: Self-pay

## 2022-12-02 ENCOUNTER — Emergency Department (HOSPITAL_BASED_OUTPATIENT_CLINIC_OR_DEPARTMENT_OTHER)
Admission: EM | Admit: 2022-12-02 | Discharge: 2022-12-02 | Disposition: A | Discharge status: Home / Self Care | Payer: Self-pay | Attending: Emergency Medicine | Admitting: Emergency Medicine

## 2022-12-02 ENCOUNTER — Encounter (HOSPITAL_BASED_OUTPATIENT_CLINIC_OR_DEPARTMENT_OTHER): Payer: Self-pay

## 2022-12-02 ENCOUNTER — Other Ambulatory Visit: Payer: Self-pay

## 2022-12-02 DIAGNOSIS — R079 Chest pain, unspecified: Secondary | ICD-10-CM | POA: Insufficient documentation

## 2022-12-02 LAB — COMPREHENSIVE METABOLIC PANEL
ALT: 26 U/L (ref 0–44)
AST: 32 U/L (ref 15–41)
Albumin: 3.5 g/dL (ref 3.5–5.0)
Alkaline Phosphatase: 66 U/L (ref 38–126)
Anion gap: 14 (ref 5–15)
BUN: 15 mg/dL (ref 6–20)
CO2: 22 mmol/L (ref 22–32)
Calcium: 8.1 mg/dL — ABNORMAL LOW (ref 8.9–10.3)
Chloride: 102 mmol/L (ref 98–111)
Creatinine, Ser: 1.15 mg/dL — ABNORMAL HIGH (ref 0.44–1.00)
GFR, Estimated: >60 mL/min (ref >60)
Glucose, Bld: 81 mg/dL (ref 70–99)
Potassium: 3.6 mmol/L (ref 3.5–5.1)
Sodium: 138 mmol/L (ref 135–145)
Total Bilirubin: 0.5 mg/dL (ref 0.3–1.2)
Total Protein: 7 g/dL (ref 6.5–8.1)

## 2022-12-02 LAB — CBC WITH DIFFERENTIAL/PLATELET
Abs Immature Granulocytes: 0 10*3/uL (ref 0.00–0.07)
Basophils Absolute: 0 10*3/uL (ref 0.0–0.1)
Basophils Relative: 0 %
Eosinophils Absolute: 0 10*3/uL (ref 0.0–0.5)
Eosinophils Relative: 1 %
HCT: 36.1 % (ref 36.0–46.0)
Hemoglobin: 12.3 g/dL (ref 12.0–15.0)
Immature Granulocytes: 0 %
Lymphocytes Relative: 35 %
Lymphs Abs: 2 10*3/uL (ref 0.7–4.0)
MCH: 32.4 pg (ref 26.0–34.0)
MCHC: 34.1 g/dL (ref 30.0–36.0)
MCV: 95 fL (ref 80.0–100.0)
Monocytes Absolute: 0.7 10*3/uL (ref 0.1–1.0)
Monocytes Relative: 12 %
Neutro Abs: 3 10*3/uL (ref 1.7–7.7)
Neutrophils Relative %: 52 %
Platelets: 308 10*3/uL (ref 150–400)
RBC: 3.8 MIL/uL — ABNORMAL LOW (ref 3.87–5.11)
RDW: 13.2 % (ref 11.5–15.5)
WBC: 5.7 10*3/uL (ref 4.0–10.5)
nRBC: 0 % (ref 0.0–0.2)

## 2022-12-02 LAB — HCG, QUANTITATIVE, PREGNANCY: hCG, Beta Chain, Quant, S: <1 m[IU]/mL (ref <5)

## 2022-12-02 LAB — LIPASE, BLOOD: Lipase: 23 U/L (ref 11–51)

## 2022-12-02 LAB — D-DIMER, QUANTITATIVE: D-Dimer, Quant: 0.87 ug{FEU}/mL — ABNORMAL HIGH (ref 0.00–0.50)

## 2022-12-02 LAB — TROPONIN I (HIGH SENSITIVITY): Troponin I (High Sensitivity): 3 ng/L (ref <18)

## 2022-12-02 MED ORDER — IOHEXOL 350 MG/ML SOLN
75.0000 mL | Freq: Once | INTRAVENOUS | Status: AC | PRN
  Administered 2022-12-02: 75 mL via INTRAVENOUS

## 2022-12-02 NOTE — ED Provider Notes (Signed)
[place] [facility] Provider Note   CSN: [id number] Arrival date & time: 12/02/22  1950    History  Chief Complaint  Patient presents with   Chest Pain    Beth Stephenson is a 27 y.o. female here for evaluation of chest pain. Began this morning.  Has had intermittent pain over the last few years, Pain pleuritic in nature. Non exertional. Typically the pain self resolves after a few hours.  Pain today has not stopped.  Pain is pleuritic, worse with deep breathing. Mild cough. Some SOB.  No hemoptysis.  No fever, nausea, vomiting, abdominal pain, current pain or swelling to lower extremities.  No history of PE or DVT. No recent surgery, immobilization, malignancy. Pain located to center of chest. No hx of pancreatitis, GERD. No chronic NSAID use, Etoh use, no bloody stool.  HPI     Home Medications Prior to Admission medications   Medication Sig Start Date End Date Taking? Authorizing Provider  famotidine (PEPCID) 20 MG tablet Take 1 tablet (20 mg total) by mouth 2 (two) times daily. 06/14/17   Dartha Lodge, PA-C  HYDROcodone-acetaminophen (NORCO/VICODIN) 5-325 MG tablet Take 1-2 tablets by mouth every 6 (six) hours as needed. 08/12/16   Ward, Layla Maw, DO  ibuprofen (ADVIL,MOTRIN) 800 MG tablet Take 1 tablet (800 mg total) by mouth every 8 (eight) hours as needed for mild pain. 08/12/16   Ward, Layla Maw, DO  naproxen (NAPROSYN) 500 MG tablet Take 1 tablet (500 mg total) by mouth 2 (two) times daily with a meal. 01/11/18   Caccavale, Sophia, PA-C  ondansetron (ZOFRAN ODT) 4 MG disintegrating tablet 4mg  ODT q4 hours prn nausea/vomit 06/14/17   Dartha Lodge, PA-C  TRAMADOL HCL PO Take by mouth.    [provider]      Allergies    Patient has no known allergies.    Review of Systems   Review of Systems  Constitutional: Negative.   HENT: Negative.    Respiratory:  Positive for cough, chest tightness and shortness of breath.    Cardiovascular:  Positive for chest pain. Negative for palpitations and leg swelling.  Gastrointestinal: Negative.   Genitourinary: Negative.   Musculoskeletal: Negative.   Skin: Negative.   Neurological: Negative.   All other systems reviewed and are negative.   Physical Exam Updated Vital Signs BP (!) 148/118   Pulse 67   Temp 98.4 F (36.9 C)   Resp 13   LMP 11/26/2022   SpO2 99%  Physical Exam Vitals and nursing note reviewed.  Constitutional:      General: She is not in acute distress.    Appearance: She is well-developed. She is not ill-appearing, toxic-appearing or diaphoretic.  HENT:     Head: Atraumatic.  Eyes:     Pupils: Pupils are equal, round, and reactive to light.  Cardiovascular:     Rate and Rhythm: Normal rate.     Pulses:          Radial pulses are 2+ on the right side and 2+ on the left side.     Heart sounds: Normal heart sounds.  Pulmonary:     Effort: Pulmonary effort is normal. No respiratory distress.     Breath sounds: Normal breath sounds.     Comments: Clear bilaterally, speaks in full sentences without difficulty Abdominal:     General: Bowel sounds are normal. There is no distension or abdominal bruit.     Palpations: Abdomen is soft.  There is no mass.     Tenderness: There is no abdominal tenderness. There is no guarding or rebound.  Musculoskeletal:        General: Normal range of motion.     Cervical back: Normal range of motion.     Right lower leg: No tenderness. No edema.     Left lower leg: No tenderness. No edema.  Skin:    General: Skin is warm and dry.     Capillary Refill: Capillary refill takes less than 2 seconds.  Neurological:     General: No focal deficit present.     Mental Status: She is alert.  Psychiatric:        Mood and Affect: Mood is anxious.     ED Results / Procedures / Treatments   Labs (all labs ordered are listed, but only abnormal results are displayed) Labs Reviewed  CBC WITH  DIFFERENTIAL/PLATELET - Abnormal; Notable for the following components:      Result Value   RBC 3.80 (*)    All other components within normal limits  COMPREHENSIVE METABOLIC PANEL - Abnormal; Notable for the following components:   Creatinine, Ser 1.15 (*)    Calcium 8.1 (*)    All other components within normal limits  D-DIMER, QUANTITATIVE - Abnormal; Notable for the following components:   D-Dimer, Quant 0.87 (*)    All other components within normal limits  LIPASE, BLOOD  HCG, QUANTITATIVE, PREGNANCY  TROPONIN I (HIGH SENSITIVITY)    EKG EKG Interpretation Date/Time:  Sunday December 02 2022 20:01:05 EDT Ventricular Rate:  68 PR Interval:  143 QRS Duration:  79 QT Interval:  402 QTC Calculation: 428 R Axis:   70  Text Interpretation: Sinus rhythm Atrial premature complexes in couplets No significant change since prior 8/14 Confirmed by Meridee Score [phone]) on 12/02/2022 8:03:10 PM  Radiology CT Angio Chest PE W and/or Wo Contrast  Result Date: 12/02/2022 CLINICAL DATA:  Pulmonary embolism (PE) suspected, low to intermediate prob, positive D-dimer centralized pressure/CP onset this AM, intermittent L arm tingling associated. Pt states pain is worse w expiration, nausea "but only when I was trying to eat something." Denies hx GERD, reflux, not on BC EXAM: CT ANGIOGRAPHY CHEST WITH CONTRAST TECHNIQUE: Multidetector CT imaging of the chest was performed using the standard protocol during bolus administration of intravenous contrast. Multiplanar CT image reconstructions and MIPs were obtained to evaluate the vascular anatomy. RADIATION DOSE REDUCTION: This exam was performed according to the departmental dose-optimization program which includes automated exposure control, adjustment of the mA and/or kV according to patient size and/or use of iterative reconstruction technique. CONTRAST:  75mL OMNIPAQUE IOHEXOL 350 MG/ML SOLN COMPARISON:  Chest x-ray 12/02/2022, chest x-ray 10/14/2012  FINDINGS: Cardiovascular: Satisfactory opacification of the pulmonary arteries to the segmental level. No evidence of pulmonary embolism. Normal heart size. No significant pericardial effusion. The thoracic aorta is normal in caliber. No atherosclerotic plaque of the thoracic aorta. No coronary artery calcifications. Mediastinum/Nodes: No enlarged mediastinal, hilar, or axillary lymph nodes. Thyroid gland, trachea, and esophagus demonstrate no significant findings. Lungs/Pleura: No focal consolidation. No pulmonary nodule. No pulmonary mass. No pleural effusion. No pneumothorax. Upper Abdomen: No acute abnormality. Musculoskeletal: No chest wall abnormality. No suspicious lytic or blastic osseous lesions. No acute displaced fracture. Review of the MIP images confirms the above findings. IMPRESSION: 1. No pulmonary embolus. 2. No acute intrathoracic abnormality. Electronically Signed   By: Tish Frederickson M.D.   On: 12/02/2022 22:28   DG Chest 2  View  Result Date: 12/02/2022 CLINICAL DATA:  Chest pain and left upper extremity paresthesia. EXAM: CHEST - 2 VIEW COMPARISON:  CTA chest, abdomen and pelvis 06/24/2019 FINDINGS: The heart size and mediastinal contours are within normal limits. Both lungs are clear. The visualized skeletal structures are unremarkable. IMPRESSION: No active cardiopulmonary disease.  Stable chest. Electronically Signed   By: Almira Bar M.D.   On: 12/02/2022 21:33    Procedures Procedures    Medications Ordered in ED Medications  iohexol (OMNIPAQUE) 350 MG/ML injection 75 mL (75 mLs Intravenous Contrast Given 12/02/22 2139)    ED Course/ Medical Decision Making/ A&P   27 year old here for evaluation of chest pain located in center of chest. Has had intermittent pain over the last few years however typically pain resolves after an hour or so.  Pain today has not.  Has associated shortness of breath.  Afebrile, nonseptic, not ill-appearing.  Heart and lungs clear.  Abdomen  soft, tender.  No clinical evidence of VTE on exam.  Plan on labs, imaging and reassess  Labs and imaging personally viewed and interpreted:  EKG wo ischemic changes CBC without leukocytosis CMP creatinine 1.15 Lipase 23 Trop 3 Ddimer 0.87--- Will order CTA chest to r/o PE DG chest wo acute findings Preg neg  Patient reassessed. Discussed labs and imaging. CTA ordered for elevated ddimer  Patient reassessed. Discussed CTA imaging acute findings.  She is currently chest pain-free.  Her follow-up outpatient, return for any worsening symptoms.  At this time I low suspicion for acute ACS, PE, dissection, pneumothorax, pancreatitis, cholecystitis bowel obstruction, perforation.  The patient has been appropriately medically screened and/or stabilized in the ED. I have low suspicion for any other emergent medical condition which would require further screening, evaluation or treatment in the ED or require inpatient management.  Patient is hemodynamically stable and in no acute distress.  Patient able to ambulate in department prior to ED.  Evaluation does not show acute pathology that would require ongoing or additional emergent interventions while in the emergency department or further inpatient treatment.  I have discussed the diagnosis with the patient and answered all questions.  Pain is been managed while in the emergency department and patient has no further complaints prior to discharge.  Patient is comfortable with plan discussed in room and is stable for discharge at this time.  I have discussed strict return precautions for returning to the emergency department.  Patient was encouraged to follow-up with PCP/specialist refer to at discharge.                                  Medical Decision Making Amount and/or Complexity of Data Reviewed External Data Reviewed: labs, radiology, ECG and notes. Labs: ordered. Decision-making details documented in ED Course. Radiology: ordered and  independent interpretation performed. Decision-making details documented in ED Course. ECG/medicine tests: ordered and independent interpretation performed. Decision-making details documented in ED Course.  Risk OTC drugs. Prescription drug management. Parenteral controlled substances. Decision regarding hospitalization. Diagnosis or treatment significantly limited by social determinants of health.          Final Clinical Impression(s) / ED Diagnoses Final diagnoses:  Nonspecific chest pain    Rx / DC Orders ED Discharge Orders     None         Amarya Kuehl A, PA-C 12/02/22 2308    Terrilee Files, MD 12/03/22 1006

## 2022-12-02 NOTE — ED Triage Notes (Signed)
Pt c/o centralized pressure/ CP onset this AM, intermittent L arm tingling associated. Pt states pain is worse w expiration, nausea "but only when I was trying to eat something." Denies hx GERD, reflux, not on [facility]

## 2022-12-02 NOTE — Discharge Instructions (Signed)
Your workup today was reassuring.  Make sure to follow-up outpatient, return for any worsening symptoms

## 2023-05-09 ENCOUNTER — Emergency Department (HOSPITAL_BASED_OUTPATIENT_CLINIC_OR_DEPARTMENT_OTHER)
Admission: EM | Admit: 2023-05-09 | Discharge: 2023-05-09 | Disposition: A | Discharge status: Home / Self Care | Payer: Self-pay | Attending: Emergency Medicine | Admitting: Emergency Medicine

## 2023-05-09 ENCOUNTER — Encounter (HOSPITAL_BASED_OUTPATIENT_CLINIC_OR_DEPARTMENT_OTHER): Payer: Self-pay | Admitting: Emergency Medicine

## 2023-05-09 ENCOUNTER — Other Ambulatory Visit: Payer: Self-pay

## 2023-05-09 ENCOUNTER — Emergency Department (HOSPITAL_BASED_OUTPATIENT_CLINIC_OR_DEPARTMENT_OTHER): Payer: Self-pay

## 2023-05-09 DIAGNOSIS — J189 Pneumonia, unspecified organism: Secondary | ICD-10-CM

## 2023-05-09 DIAGNOSIS — J168 Pneumonia due to other specified infectious organisms: Secondary | ICD-10-CM | POA: Insufficient documentation

## 2023-05-09 LAB — CBC
HCT: 33.9 % — ABNORMAL LOW (ref 36.0–46.0)
Hemoglobin: 11.7 g/dL — ABNORMAL LOW (ref 12.0–15.0)
MCH: 32.6 pg (ref 26.0–34.0)
MCHC: 34.5 g/dL (ref 30.0–36.0)
MCV: 94.4 fL (ref 80.0–100.0)
Platelets: 378 10*3/uL (ref 150–400)
RBC: 3.59 MIL/uL — ABNORMAL LOW (ref 3.87–5.11)
RDW: 12.9 % (ref 11.5–15.5)
WBC: 18.7 10*3/uL — ABNORMAL HIGH (ref 4.0–10.5)
nRBC: 0 % (ref 0.0–0.2)

## 2023-05-09 LAB — BASIC METABOLIC PANEL
Anion gap: 13 (ref 5–15)
BUN: 5 mg/dL — ABNORMAL LOW (ref 6–20)
CO2: 22 mmol/L (ref 22–32)
Calcium: 8.9 mg/dL (ref 8.9–10.3)
Chloride: 98 mmol/L (ref 98–111)
Creatinine, Ser: 0.77 mg/dL (ref 0.44–1.00)
GFR, Estimated: >60 mL/min (ref >60)
Glucose, Bld: 94 mg/dL (ref 70–99)
Potassium: 3.4 mmol/L — ABNORMAL LOW (ref 3.5–5.1)
Sodium: 133 mmol/L — ABNORMAL LOW (ref 135–145)

## 2023-05-09 LAB — TROPONIN I (HIGH SENSITIVITY): Troponin I (High Sensitivity): 3 ng/L (ref <18)

## 2023-05-09 LAB — RESP PANEL BY RT-PCR (RSV, FLU A&B, COVID)  RVPGX2
Influenza A by PCR: NEGATIVE
Influenza B by PCR: NEGATIVE
Resp Syncytial Virus by PCR: NEGATIVE
SARS Coronavirus 2 by RT PCR: NEGATIVE

## 2023-05-09 LAB — PREGNANCY, URINE: Preg Test, Ur: NEGATIVE

## 2023-05-09 MED ORDER — ACETAMINOPHEN 325 MG PO TABS
650.0000 mg | ORAL_TABLET | Freq: Once | ORAL | Status: AC | PRN
  Administered 2023-05-09: 650 mg via ORAL
  Filled 2023-05-09: qty 2

## 2023-05-09 MED ORDER — LACTATED RINGERS IV BOLUS
1000.0000 mL | Freq: Once | INTRAVENOUS | Status: AC
  Administered 2023-05-09: 1000 mL via INTRAVENOUS

## 2023-05-09 MED ORDER — AZITHROMYCIN 250 MG PO TABS
500.0000 mg | ORAL_TABLET | Freq: Once | ORAL | Status: AC
  Administered 2023-05-09: 500 mg via ORAL
  Filled 2023-05-09: qty 2

## 2023-05-09 MED ORDER — AMOXICILLIN-POT CLAVULANATE 875-125 MG PO TABS
1.0000 | ORAL_TABLET | Freq: Two times a day (BID) | ORAL | 0 refills | Status: AC
Start: 2023-05-09 — End: ?

## 2023-05-09 MED ORDER — AZITHROMYCIN 250 MG PO TABS
250.0000 mg | ORAL_TABLET | Freq: Every day | ORAL | 0 refills | Status: AC
Start: 2023-05-09 — End: 2023-05-14

## 2023-05-09 MED ORDER — ONDANSETRON 4 MG PO TBDP
4.0000 mg | ORAL_TABLET | Freq: Three times a day (TID) | ORAL | 0 refills | Status: AC | PRN
Start: 2023-05-09 — End: 2023-05-11

## 2023-05-09 MED ORDER — IBUPROFEN 800 MG PO TABS
800.0000 mg | ORAL_TABLET | Freq: Once | ORAL | Status: AC
  Administered 2023-05-09: 800 mg via ORAL
  Filled 2023-05-09: qty 1

## 2023-05-09 MED ORDER — AMOXICILLIN-POT CLAVULANATE 875-125 MG PO TABS
1.0000 | ORAL_TABLET | Freq: Once | ORAL | Status: AC
  Administered 2023-05-09: 1 via ORAL
  Filled 2023-05-09: qty 1

## 2023-05-09 NOTE — Discharge Instructions (Signed)
 Thank you for letting us evaluate you today.  You do have pneumonia of your right lung.  I have provided you with 2 doses of antibiotics here in the emergency department as well as some fluids to rehydrate you.  Please make sure to pick up antibiotics tomorrow and start them tomorrow morning.  Please take as directed and do not miss a dose.  This may cause some nausea, vomiting, diarrhea.  I have sent some Zofran to use as needed for nausea please make sure to maintain oral hydration with water, Gatorade, Pedialyte, chicken broth.  If you do not eat that is okay.  You may use Tylenol and ibuprofen intermittently every 8 hours as needed for fever or pain.  However, I hope that you feel better within the next 3 to 4 days.  I provided you with [place] [facility] to use as needed for primary care and follow-up  Return to Emergency Department if you experience significant worsening of symptoms, shortness of breath, chest pain.

## 2023-05-09 NOTE — ED Triage Notes (Signed)
 Pt POV c/o chest pain radiating to back x5 days. Generalized body aches x1 week.   Reports hemoptysis this AM.  Reports sneezing yesterday, new cough today.    Took advil appx 0630 today.

## 2023-05-09 NOTE — ED Provider Notes (Signed)
 [place] [facility] Provider Note   CSN: [id number] Arrival date & time: 05/09/23  1450     History  Chief Complaint  Patient presents with   Hemoptysis   Chest Pain    Beth Stephenson is a 28 y.o. female with no noted past medical history presents to emergency department for evaluation of bodyaches, sore throat, fever (Tmax 102 F), decreased appetite that started on Thursday, 05-02-2023, 7 days prior.  She endorses that chest pain, shortness of breath, adductive cough, right-sided back pain started 4 days ago.  Chest pain is reported as sharp.  Chest pain and shortness of breath are both worsened with exertion.  She has been drinking water and gatorade.  Today, she sought ED evaluation as she had an episode of hemoptysis with coughing.  She has taken Alleve for body aches. Denies NVD   Chest Pain Associated symptoms: no abdominal pain, no cough, no dizziness, no fatigue, no fever, no headache, no nausea, no numbness, no palpitations, no shortness of breath, no vomiting and no weakness       Home Medications Prior to Admission medications   Medication Sig Start Date End Date Taking? Authorizing Provider  amoxicillin-clavulanate (AUGMENTIN) 875-125 MG tablet Take 1 tablet by mouth every 12 (twelve) hours. 05/09/23  Yes Judithann Sheen, PA  azithromycin (ZITHROMAX) 250 MG tablet Take 1 tablet (250 mg total) by mouth daily for 5 days. Take first 2 tablets together, then 1 every day until finished. 05/09/23 05/14/23 Yes Judithann Sheen, PA  ondansetron (ZOFRAN-ODT) 4 MG disintegrating tablet Take 1 tablet (4 mg total) by mouth every 8 (eight) hours as needed for up to 2 days for nausea or vomiting. 05/09/23 05/11/23 Yes Judithann Sheen, PA  famotidine (PEPCID) 20 MG tablet Take 1 tablet (20 mg total) by mouth 2 (two) times daily. 06/14/17   Rosezella Rumpf, PA-C  HYDROcodone-acetaminophen (NORCO/VICODIN) 5-325 MG tablet Take 1-2 tablets by mouth every 6 (six)  hours as needed. 08/12/16   Ward, Layla Maw, DO  ibuprofen (ADVIL,MOTRIN) 800 MG tablet Take 1 tablet (800 mg total) by mouth every 8 (eight) hours as needed for mild pain. 08/12/16   Ward, Layla Maw, DO  naproxen (NAPROSYN) 500 MG tablet Take 1 tablet (500 mg total) by mouth 2 (two) times daily with a meal. 01/11/18   Caccavale, Sophia, PA-C  ondansetron (ZOFRAN ODT) 4 MG disintegrating tablet 4mg  ODT q4 hours prn nausea/vomit 06/14/17   Rosezella Rumpf, PA-C  TRAMADOL HCL PO Take by mouth.    [provider]      Allergies    Patient has no known allergies.    Review of Systems   Review of Systems  Constitutional:  Negative for chills, fatigue and fever.  Respiratory:  Negative for cough, chest tightness, shortness of breath and wheezing.   Cardiovascular:  Positive for chest pain. Negative for palpitations.  Gastrointestinal:  Negative for abdominal pain, constipation, diarrhea, nausea and vomiting.  Neurological:  Negative for dizziness, seizures, weakness, light-headedness, numbness and headaches.    Physical Exam Updated Vital Signs BP 115/66 (BP Location: Right Arm)   Pulse (!) 112   Temp 99.6 F (37.6 C) (Oral)   Resp 19   Ht 5\' 2"  (1.575 m)   Wt 72.6 kg   LMP 04/25/2023   SpO2 99%   BMI 29.26 kg/m  Physical Exam Vitals and nursing note reviewed.  Constitutional:      General: She is not  in acute distress.    Appearance: Normal appearance. She is not ill-appearing.  HENT:     Head: Normocephalic and atraumatic.     Right Ear: Hearing normal. There is impacted cerumen.     Left Ear: Hearing normal. There is impacted cerumen.     Nose: Congestion present.     Mouth/Throat:     Mouth: Mucous membranes are moist.     Pharynx: Uvula midline. Postnasal drip present. No pharyngeal swelling, oropharyngeal exudate, posterior oropharyngeal erythema or uvula swelling.     Tonsils: No tonsillar exudate or tonsillar abscesses. 0 on the right. 0 on the left.  Eyes:      General: Lids are normal. Vision grossly intact.     Conjunctiva/sclera: Conjunctivae normal.  Neck:     Comments: No meningismus Cardiovascular:     Rate and Rhythm: Tachycardia present.     Comments: Tachycardic initially at 118 decreased to 110 bpm following Tylenol, Motrin, IVF Pulmonary:     Effort: Pulmonary effort is normal. No respiratory distress.     Breath sounds: Normal breath sounds.     Comments: Speaking in full complete sentences without difficulty nor tachypnea nor respiratory distress.  No hypoxia with ambulation trial Musculoskeletal:     Cervical back: Normal range of motion and neck supple. No rigidity.     Right lower leg: No edema.     Left lower leg: No edema.  Skin:    General: Skin is warm.     Capillary Refill: Capillary refill takes less than 2 seconds.     Coloration: Skin is not jaundiced or pale.  Neurological:     Mental Status: She is alert and oriented to person, place, and time. Mental status is at baseline.     ED Results / Procedures / Treatments   Labs (all labs ordered are listed, but only abnormal results are displayed) Labs Reviewed  BASIC METABOLIC PANEL - Abnormal; Notable for the following components:      Result Value   Sodium 133 (*)    Potassium 3.4 (*)    BUN 5 (*)    All other components within normal limits  CBC - Abnormal; Notable for the following components:   WBC 18.7 (*)    RBC 3.59 (*)    Hemoglobin 11.7 (*)    HCT 33.9 (*)    All other components within normal limits  RESP PANEL BY RT-PCR (RSV, FLU A&B, COVID)  [id number]  PREGNANCY, URINE  TROPONIN I (HIGH SENSITIVITY)    EKG EKG Interpretation Date/Time:  Thursday May 09 2023 15:18:45 EDT Ventricular Rate:  121 PR Interval:  130 QRS Duration:  72 QT Interval:  295 QTC Calculation: 419 R Axis:   60  Text Interpretation: Sinus tachycardia Nonspecific T abnormalities, anterior leads Confirmed by Vanetta Mulders [phone]) on 05/09/2023 6:23:24  PM  Radiology DG Chest 2 View Result Date: 05/09/2023 CLINICAL DATA:  Right-sided chest pain. EXAM: CHEST - 2 VIEW COMPARISON:  Chest radiograph 12/02/2022 FINDINGS: Stable cardiac and mediastinal contours. Right upper lobe consolidation. Patchy consolidation right lower lung. Left lung is clear. No pleural effusion or pneumothorax. Thoracic spine degenerative changes. IMPRESSION: Right upper lobe and right lower lung consolidation concerning for pneumonia. Recommend follow-up chest radiograph in 6-8 weeks to ensure resolution. Electronically Signed   By: Annia Belt M.D.   On: 05/09/2023 17:14    Procedures Procedures    Medications Ordered in ED Medications  acetaminophen (TYLENOL) tablet 650 mg (650 mg Oral Given  05/09/23 1517)  lactated ringers bolus 1,000 mL (0 mLs Intravenous Stopped 05/09/23 1935)  ibuprofen (ADVIL) tablet 800 mg (800 mg Oral Given 05/09/23 1830)  azithromycin (ZITHROMAX) tablet 500 mg (500 mg Oral Given 05/09/23 1830)  amoxicillin-clavulanate (AUGMENTIN) 875-125 MG per tablet 1 tablet (1 tablet Oral Given 05/09/23 1830)    ED Course/ Medical Decision Making/ A&P                                 Medical Decision Making Amount and/or Complexity of Data Reviewed Labs: ordered. Radiology: ordered.  Risk OTC drugs. Prescription drug management.   Patient presents to the ED for concern of  bodyaches, sore throat, fever (Tmax 102 F), decreased appetite shob, this involves an extensive number of treatment options, and is a complaint that carries with it a high risk of complications and morbidity.  The differential diagnosis includes COVID, flu, pneumonia, ACS   Co morbidities that complicate the patient evaluation  None   Additional history obtained:  Additional history obtained from  Nursing   External records from outside source obtained and reviewed including triage RN note   Lab Tests:  I Ordered, and personally interpreted labs.  The pertinent  results include:   Leukocytosis of 18.7 Respiratory panel negative hCG negative Troponin x 2 negative   Imaging Studies ordered:  I ordered imaging studies including x-ray I independently visualized and interpreted imaging which showed right upper and lower lung pneumonia I agree with the radiologist interpretation   Cardiac Monitoring:  The patient was maintained on a cardiac monitor.  I personally viewed and interpreted the cardiac monitored which showed an underlying rhythm of: Sinus tachycardia with no STE   Medicines ordered and prescription drug management:  I ordered medication including zithromax, augmentin, zofran  for PNA, nausea  Reevaluation of the patient after these medicines showed that the patient improved I have reviewed the patients home medicines and have made adjustments as needed     Problem List / ED Course:  Right lung pneumonia Overall well-appearing considering pneumonia diagnosis.   no tachypnea nor respiratory distress.  Maintaining oxygen saturation wo supplementation. No hypotension.  No hypoxia during ambulation trial Tachycardia improved following Motrin and Tylenol and IVF. Shared decision making is had with patient as she wishes to go home and try oral antibiotics vs admission for PNA.  Provided 1 dose here in emergency department.  Discussed antibiotic stewardship and strict return to ED precautions Discussed f/u with PCP, ED, or UC for repeat CXR or if symptoms do not improve or worsen   Reevaluation:  After the interventions noted above, I reevaluated the patient and found that they have :improved   Social Determinants of Health:  No PCP-provider recommendation   Dispostion:  After consideration of the diagnostic results and the patients response to treatment, I feel that the patent would benefit from outpatient management with antibiotics for pneumonia.   Discussed ED workup, disposition, return to ED precautions with patient  who expresses understanding agrees with plan.  All questions answered to their satisfaction.  They are agreeable to plan.  Discharge instructions provided on paperwork Final Clinical Impression(s) / ED Diagnoses Final diagnoses:  Pneumonia of right lung due to infectious organism, unspecified part of lung    Rx / DC Orders ED Discharge Orders          Ordered    ondansetron (ZOFRAN-ODT) 4 MG disintegrating tablet  Every 8  hours PRN        05/09/23 1942    amoxicillin-clavulanate (AUGMENTIN) 875-125 MG tablet  Every 12 hours        05/09/23 1947    azithromycin (ZITHROMAX) 250 MG tablet  Daily        05/09/23 1947              Judithann Sheen, PA 05/10/23 0019    Vanetta Mulders, MD 05/11/23 [phone]
